# Patient Record
Sex: Male | Born: 1990 | Race: White | Hispanic: Yes | Marital: Single | Smoking: Never smoker
Health system: Southern US, Community
[De-identification: ages and names within clinical notes are randomized; demographics above are authoritative.]

---

## 2013-07-17 ENCOUNTER — Emergency Department (HOSPITAL_COMMUNITY)
Admission: EM | Admit: 2013-07-17 | Discharge: 2013-07-17 | Disposition: A | Discharge status: Home / Self Care | Payer: Self-pay | Attending: Emergency Medicine | Admitting: Emergency Medicine

## 2013-07-17 ENCOUNTER — Encounter (HOSPITAL_COMMUNITY): Payer: Self-pay | Admitting: Emergency Medicine

## 2013-07-17 ENCOUNTER — Emergency Department (HOSPITAL_COMMUNITY): Payer: Self-pay

## 2013-07-17 DIAGNOSIS — Z23 Encounter for immunization: Secondary | ICD-10-CM | POA: Insufficient documentation

## 2013-07-17 DIAGNOSIS — Y939 Activity, unspecified: Secondary | ICD-10-CM | POA: Insufficient documentation

## 2013-07-17 DIAGNOSIS — W268XXA Contact with other sharp object(s), not elsewhere classified, initial encounter: Secondary | ICD-10-CM | POA: Insufficient documentation

## 2013-07-17 DIAGNOSIS — S61409A Unspecified open wound of unspecified hand, initial encounter: Secondary | ICD-10-CM | POA: Insufficient documentation

## 2013-07-17 DIAGNOSIS — Y929 Unspecified place or not applicable: Secondary | ICD-10-CM | POA: Insufficient documentation

## 2013-07-17 DIAGNOSIS — IMO0002 Reserved for concepts with insufficient information to code with codable children: Secondary | ICD-10-CM

## 2013-07-17 MED ORDER — ONDANSETRON 4 MG PO TBDP
8.0000 mg | ORAL_TABLET | Freq: Once | ORAL | Status: AC
  Administered 2013-07-17: 8 mg via ORAL
  Filled 2013-07-17: qty 2

## 2013-07-17 MED ORDER — HYDROCODONE-ACETAMINOPHEN 5-325 MG PO TABS
2.0000 | ORAL_TABLET | Freq: Once | ORAL | Status: AC
  Administered 2013-07-17: 2 via ORAL
  Filled 2013-07-17: qty 2

## 2013-07-17 MED ORDER — CEPHALEXIN 500 MG PO CAPS: 500.0000 mg | ORAL_CAPSULE | Freq: Four times a day (QID) | ORAL | Status: AC

## 2013-07-17 MED ORDER — TETANUS-DIPHTH-ACELL PERTUSSIS 5-2.5-18.5 LF-MCG/0.5 IM SUSP
0.5000 mL | Freq: Once | INTRAMUSCULAR | Status: AC
  Administered 2013-07-17: 0.5 mL via INTRAMUSCULAR
  Filled 2013-07-17: qty 0.5

## 2013-07-17 NOTE — ED Notes (Signed)
Browning, PA at bedside 

## 2013-07-17 NOTE — Discharge Instructions (Signed)

## 2013-07-17 NOTE — ED Notes (Signed)
PA at bedside to suture hand at this time.

## 2013-07-17 NOTE — ED Notes (Signed)
Pt states that he cut his hand on some barbed wire and bandaged it at home with "Someone helping me."  Pt has bandage applied and bleeding is controlled at triage.  Communication done using phone interpreter

## 2013-07-17 NOTE — ED Provider Notes (Signed)
CSN: [id number]     Arrival date & time 07/17/13  1856 History  This chart was scribed for non-physician practitioner Roxy Horseman, PA-C working with Glynn Octave, MD by Danella Maiers, ED Scribe. This patient was seen in room TR08C/TR08C and the patient's care was started at 7:21 PM.    Chief Complaint  Patient presents with  . Laceration   The history is provided by the patient. A language interpreter was used.   HPI Comments: Ian Meyers is a 23 y.o. male who presents to the Emergency Department with the chief complaint of a laceration to his left hand from cutting it on some barbed wire today. Bleeding is controlled. He rates the severity of the pain as a 5/10. He is unsure of the date of his last tetanus.     History reviewed. No pertinent past medical history. History reviewed. No pertinent past surgical history. No family history on file. History  Substance Use Topics  . Smoking status: Never Smoker   . Smokeless tobacco: Not on file  . Alcohol Use: No    Review of Systems  Constitutional: Negative for fever.  Skin: Positive for wound.  Neurological: Negative for numbness.      Allergies  Review of patient's allergies indicates no known allergies.  Home Medications  No current outpatient prescriptions on file. BP 132/77  Pulse 66  Temp(Src) 98.5 F (36.9 C) (Oral)  Resp 16  Ht 5' 2.6" (1.59 m)  Wt 132 lb 4.4 oz (60 kg)  BMI 23.73 kg/m2  SpO2 100% Physical Exam  Nursing note and vitals reviewed. Constitutional: He is oriented to person, place, and time. He appears well-developed and well-nourished. No distress.  HENT:  Head: Normocephalic and atraumatic.  Eyes: EOM are normal.  Neck: Neck supple. No tracheal deviation present.  Cardiovascular: Normal rate.   Pulmonary/Chest: Effort normal. No respiratory distress.  Musculoskeletal: Normal range of motion.  Left hand ROM and strength 5/5. No evidence of tendon or ligament injury of the hand or the  wrist.   Neurological: He is alert and oriented to person, place, and time.  Skin: Skin is warm and dry.  4cm laceration to the lateral hypothenar eminence. No obvious retained foreign body. Bleeding is controlled.   Psychiatric: He has a normal mood and affect. His behavior is normal.    ED Course  Procedures (including critical care time) Medications  Tdap (BOOSTRIX) injection 0.5 mL (0.5 mLs Intramuscular Given 07/17/13 1934)  HYDROcodone-acetaminophen (NORCO/VICODIN) 5-325 MG per tablet 2 tablet (2 tablets Oral Given 07/17/13 1933)    DIAGNOSTIC STUDIES: Oxygen Saturation is 100% on RA, normal by my interpretation.    COORDINATION OF CARE: 7:26 PM- Discussed treatment plan with pt which includes x-ray. Pt agrees to plan.  9:17 PM- X-ray shows no bony abnormality. Will proceed with laceration repair. Pt agrees.  LACERATION REPAIR Performed by: Roxy Horseman, PA-C  Consent: Verbal consent obtained. Risks and benefits: risks, benefits and alternatives were discussed Patient identity confirmed: provided demographic data Time out performed prior to procedure Prepped and Draped in normal sterile fashion Wound explored Laceration Location: lateral hypothenar eminence Laceration Length: 4cm No Foreign Bodies seen or palpated Anesthesia: local infiltration Local anesthetic: lidocaine 2% with epinephrine Anesthetic total: 5 ml Irrigation method: syringe Amount of cleaning: standard Skin closure: 4-0 prolene Number of sutures or staples: 9 Technique: interrupted Patient tolerance: Patient tolerated the procedure well with no immediate complications.     Labs Review Labs Reviewed - No data to display  Imaging Review Dg Hand Complete Left  07/17/2013   CLINICAL DATA:  Recent traumatic injury and pain  EXAM: LEFT HAND - COMPLETE 3+ VIEW  COMPARISON:  None.  FINDINGS: A large soft tissue defect is noted consistent with the patient's given clinical history. No underlying bony  abnormality is seen.  IMPRESSION: Soft tissue injury without acute bony abnormalities.   Electronically Signed   By: Alcide CleverMark  Lukens M.D.   On: 07/17/2013 21:10     EKG Interpretation None      MDM   Final diagnoses:  Laceration    Patient with laceration. Repaired in the emergency department. Tetanus is updated. No foreign bodies. The laceration was dirty, will give some antibiotics. Suture removal in 7 days. Patient understands and agrees with plan. I personally performed the services described in this documentation, which was scribed in my presence. The recorded information has been reviewed and is accurate.    Roxy Horsemanobert Dane Kopke, PA-C 07/17/13 2212

## 2013-07-17 NOTE — ED Notes (Signed)
Approx 4 cm laceration to palm of left hand. Bleeding controlled at this time.

## 2013-07-17 NOTE — ED Notes (Signed)
Patient returned from xray at this time.

## 2013-07-17 NOTE — ED Provider Notes (Signed)
Medical screening examination/treatment/procedure(s) were performed by non-physician practitioner and as supervising physician I was immediately available for consultation/collaboration.   EKG Interpretation None        Trenace Coughlin, MD 07/17/13 2355 

## 2015-07-28 IMAGING — CR DG HAND COMPLETE 3+V*L*
3 series · 3 of 3 positions shown · non-contrast
Comparison: None.

CLINICAL DATA: Recent traumatic injury and pain

EXAM:
LEFT HAND - COMPLETE 3+ VIEW

[x hand pa left]
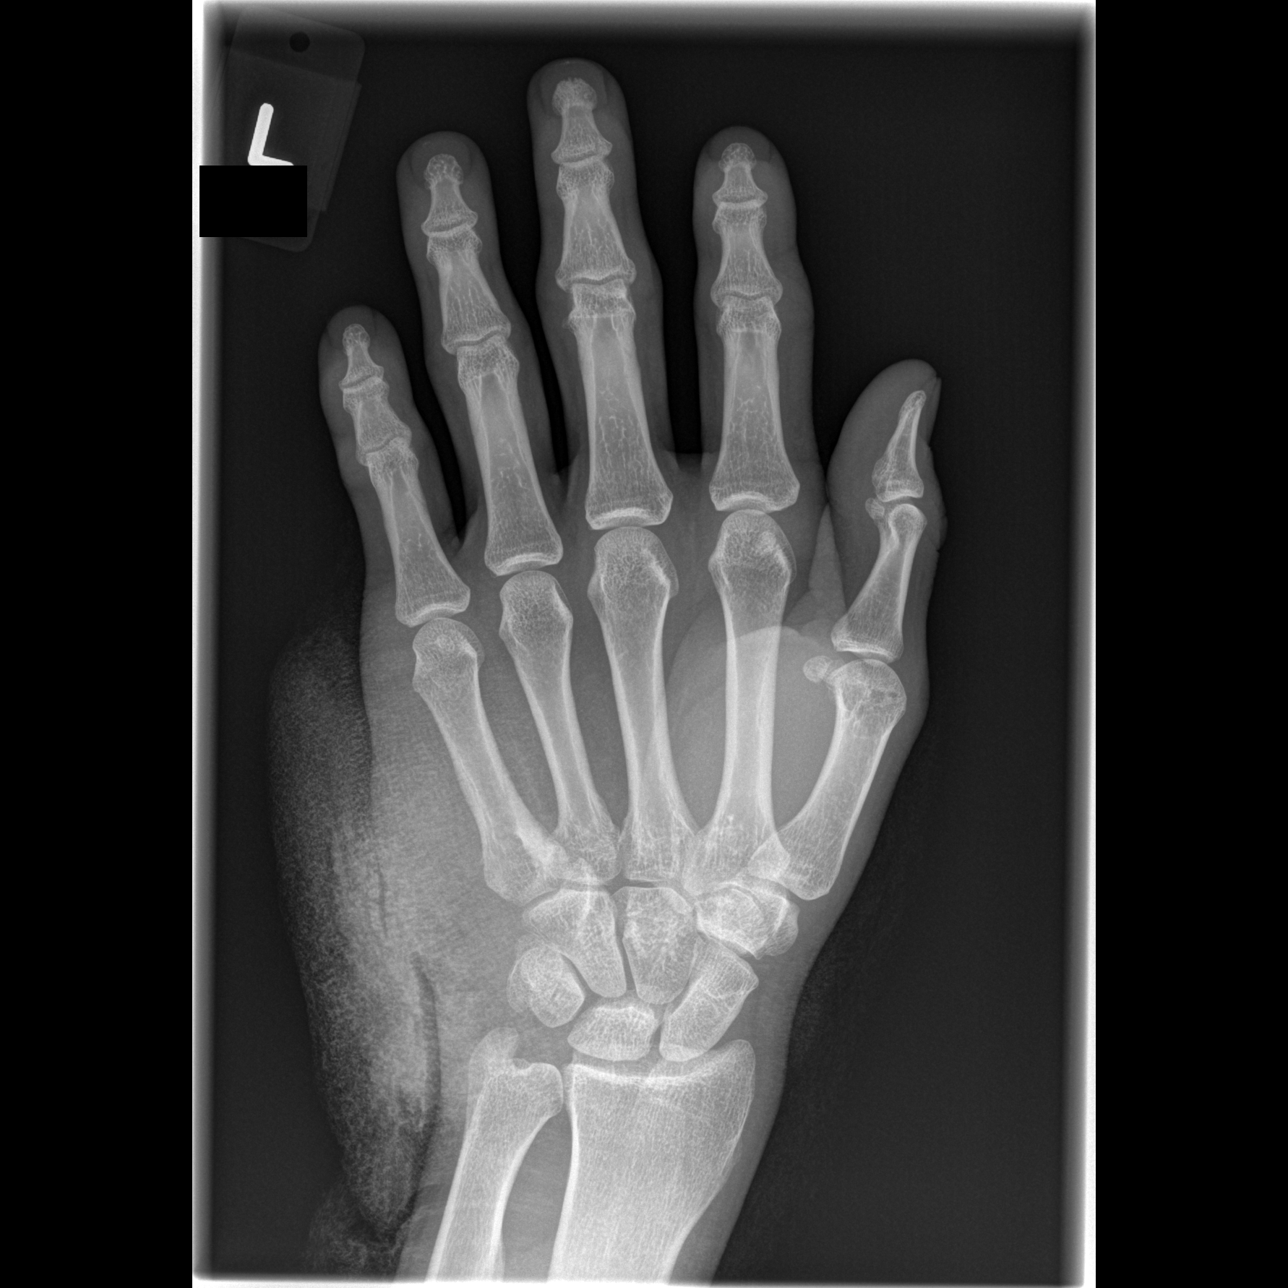

[x hand oblique left]
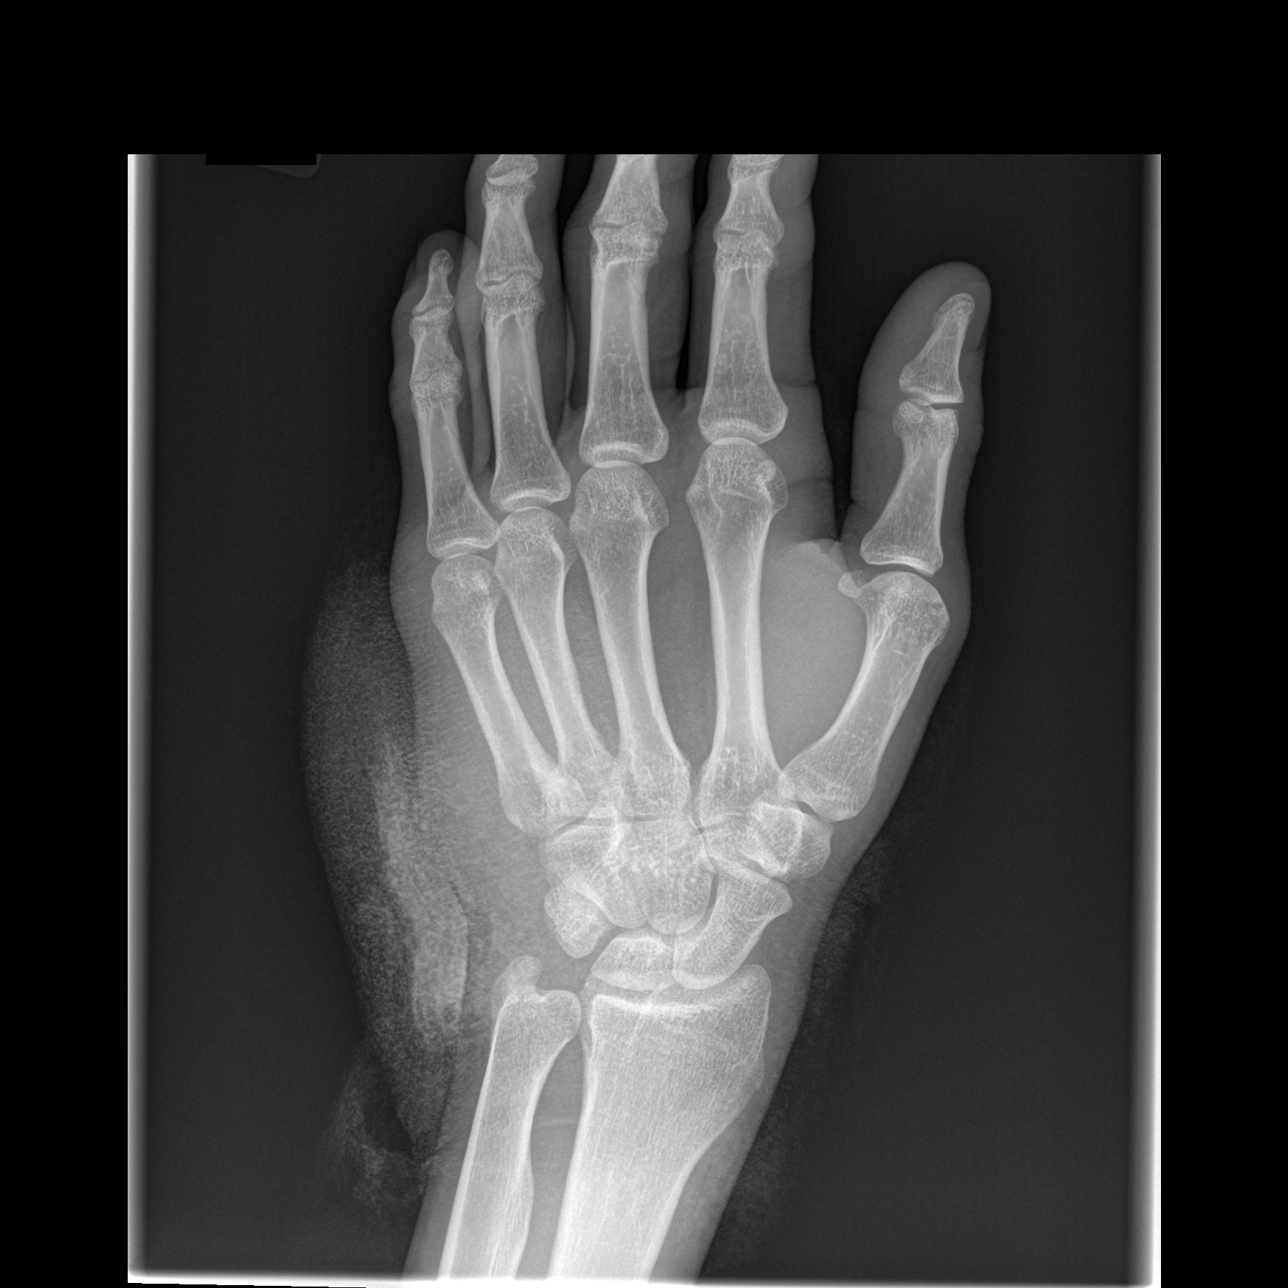

[x hand lat left]
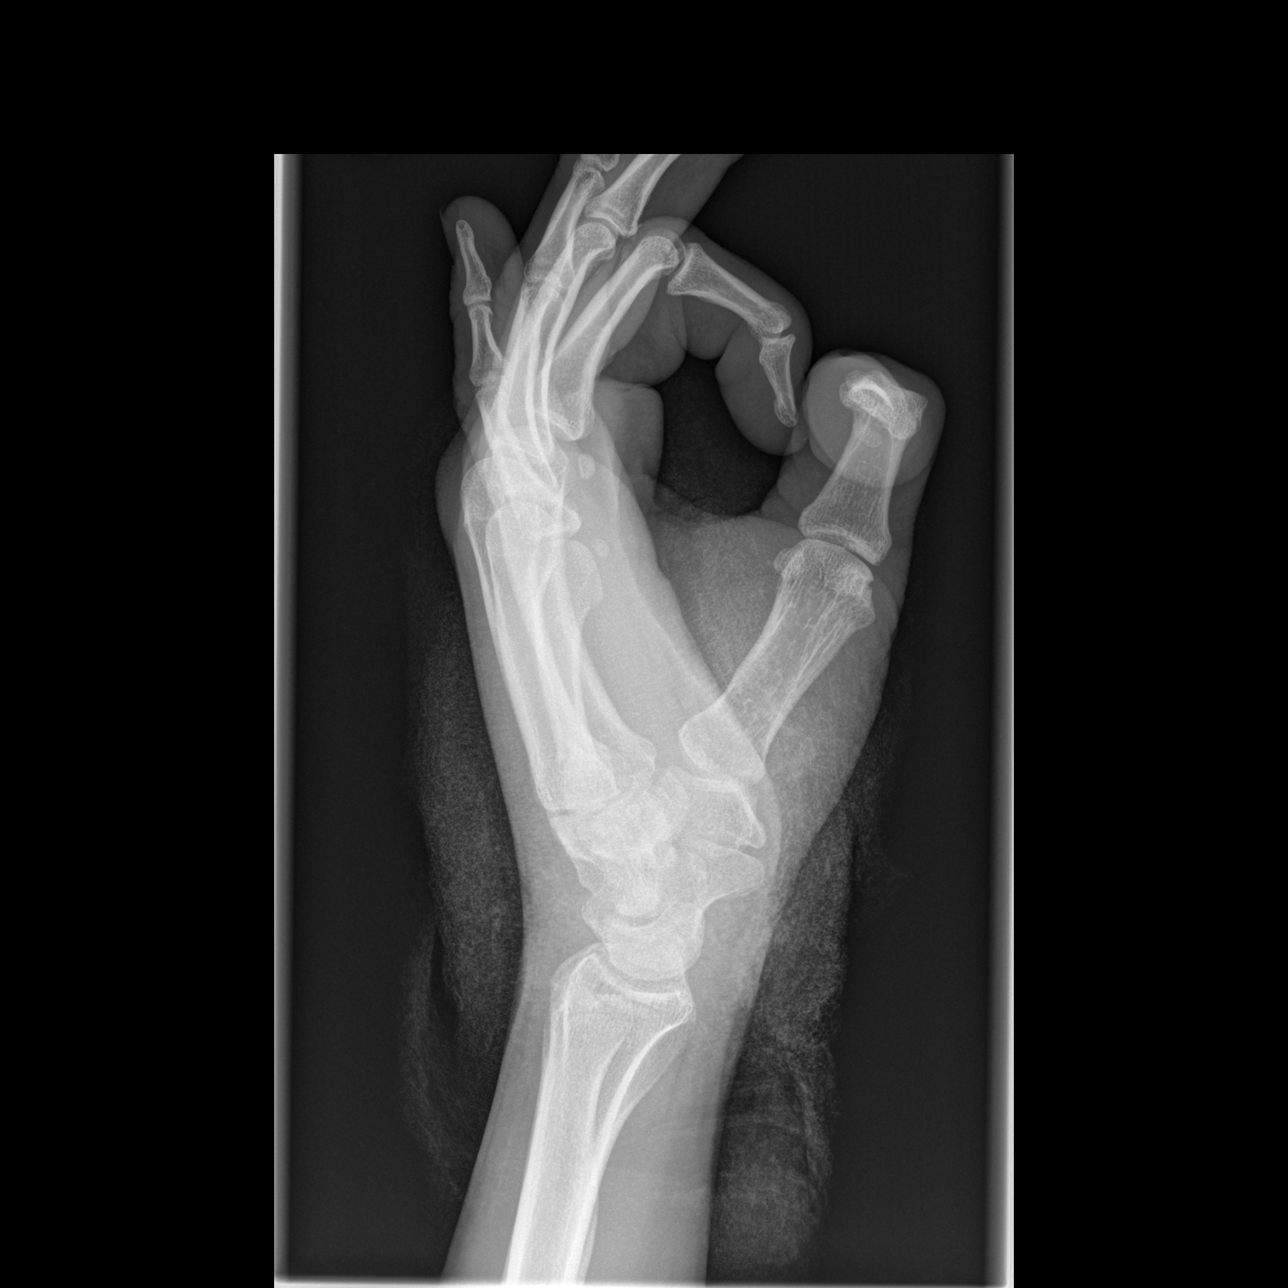

[3 of 3 positions shown; findings below may reference images not displayed]

FINDINGS: A large soft tissue defect is noted consistent with the patient's
given clinical history. No underlying bony abnormality is seen.
IMPRESSION: Soft tissue injury without acute bony abnormalities.
